# Patient Record
Sex: Female | Born: 2000 | Race: Black or African American | Hispanic: No | Marital: Single | Smoking: Never smoker
Health system: Southern US, Community
[De-identification: ages and names within clinical notes are randomized; demographics above are authoritative.]

## PROBLEM LIST (undated history)

## (undated) DIAGNOSIS — T7840XA Allergy, unspecified, initial encounter: Secondary | ICD-10-CM

## (undated) DIAGNOSIS — J45909 Unspecified asthma, uncomplicated: Secondary | ICD-10-CM

## (undated) DIAGNOSIS — F909 Attention-deficit hyperactivity disorder, unspecified type: Secondary | ICD-10-CM

## (undated) DIAGNOSIS — L83 Acanthosis nigricans: Secondary | ICD-10-CM

## (undated) DIAGNOSIS — E669 Obesity, unspecified: Secondary | ICD-10-CM

## (undated) HISTORY — PX: AMPUTATION FINGER: SHX6594

## (undated) HISTORY — DX: Attention-deficit hyperactivity disorder, unspecified type: F90.9

## (undated) HISTORY — PX: TONSILLECTOMY: SUR1361

## (undated) HISTORY — DX: Unspecified asthma, uncomplicated: J45.909

## (undated) HISTORY — DX: Allergy, unspecified, initial encounter: T78.40XA

## (undated) HISTORY — DX: Obesity, unspecified: E66.9

## (undated) HISTORY — PX: ADENOIDECTOMY: SUR15

## (undated) HISTORY — DX: Acanthosis nigricans: L83

---
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
## 2019-01-03 ENCOUNTER — Encounter: Payer: Self-pay | Admitting: Family Medicine

## 2019-01-03 ENCOUNTER — Encounter: Payer: Self-pay | Admitting: Nurse Practitioner

## 2022-11-09 ENCOUNTER — Ambulatory Visit: Payer: Self-pay

## 2022-11-16 ENCOUNTER — Ambulatory Visit: Payer: Self-pay | Admitting: Family Medicine

## 2022-12-21 ENCOUNTER — Ambulatory Visit: Payer: Medicaid Other | Admitting: Nurse Practitioner

## 2022-12-21 NOTE — Progress Notes (Deleted)
There were no vitals taken for this visit.   Subjective:    Patient ID: Kathryn Brennan, female    DOB: [birth year], 22 y.o.   MRN: [id number]  HPI: Kathryn Brennan is a 22 y.o. female  No chief complaint on file.  Establish care: her last physical was ***.  Medical history includes ***.  Family history includes ***.  Health maintenance ***.   Relevant past medical, surgical, family and social history reviewed and updated as indicated. Interim medical history since our last visit reviewed. Allergies and medications reviewed and updated.  Review of Systems  Constitutional: Negative for fever or weight change.  Respiratory: Negative for cough and shortness of breath.   Cardiovascular: Negative for chest pain or palpitations.  Gastrointestinal: Negative for abdominal pain, no bowel changes.  Musculoskeletal: Negative for gait problem or joint swelling.  Skin: Negative for rash.  Neurological: Negative for dizziness or headache.  No other specific complaints in a complete review of systems (except as listed in HPI above).      Objective:    There were no vitals taken for this visit.  Wt Readings from Last 3 Encounters:  01/22/18 222 lb 4.8 oz (100.8 kg) (99 %, Z= 2.23)*  10/03/17 227 lb 1.6 oz (103 kg) (99 %, Z= 2.28)*  09/06/17 234 lb 6.4 oz (106.3 kg) (>99 %, Z= 2.34)*   * Growth percentiles are based on CDC (Girls, 2-20 Years) data.    Physical Exam  Constitutional: Patient appears well-developed and well-nourished. Obese *** No distress.  HEENT: head atraumatic, normocephalic, pupils equal and reactive to light, ears ***, neck supple, throat within normal limits Cardiovascular: Normal rate, regular rhythm and normal heart sounds.  No murmur heard. No BLE edema. Pulmonary/Chest: Effort normal and breath sounds normal. No respiratory distress. Abdominal: Soft.  There is no tenderness. Psychiatric: Patient has a normal mood and affect. behavior is normal. Judgment and  thought content normal.  Results for orders placed or performed in visit on 01/22/18  CBC  Result Value Ref Range   WBC 8.2 4.5 - 13.0 Thousand/uL   RBC 4.40 3.80 - 5.10 Million/uL   Hemoglobin 12.5 11.5 - 15.3 g/dL   HCT 35.0 09.3 - 81.8 %   MCV 82.5 78.0 - 98.0 fL   MCH 28.4 25.0 - 35.0 pg   MCHC 34.4 31.0 - 36.0 g/dL   RDW 29.9 37.1 - 69.6 %   Platelets 370 140 - 400 Thousand/uL   MPV 10.8 7.5 - 12.5 fL  Fe+TIBC+Fer  Result Value Ref Range   Iron 67 27 - 164 mcg/dL   TIBC 789 381 - 017 mcg/dL (calc)   %SAT 16 8 - 45 % (calc)   Ferritin 15 6 - 67 ng/mL  COMPLETE METABOLIC PANEL WITH GFR  Result Value Ref Range   Glucose, Bld 76 65 - 99 mg/dL   BUN 10 7 - 20 mg/dL   Creat 5.10 2.58 - 5.27 mg/dL   BUN/Creatinine Ratio NOT APPLICABLE 6 - 22 (calc)   Sodium 139 135 - 146 mmol/L   Potassium 4.5 3.8 - 5.1 mmol/L   Chloride 106 98 - 110 mmol/L   CO2 25 20 - 32 mmol/L   Calcium 9.9 8.9 - 10.4 mg/dL   Total Protein 7.4 6.3 - 8.2 g/dL   Albumin 4.5 3.6 - 5.1 g/dL   Globulin 2.9 2.0 - 3.8 g/dL (calc)   AG Ratio 1.6 1.0 - 2.5 (calc)   Total Bilirubin 0.4 0.2 -  1.1 mg/dL   Alkaline phosphatase (APISO) 108 47 - 176 U/L   AST 15 12 - 32 U/L   ALT 13 5 - 32 U/L  VITAMIN D 25 Hydroxy (Vit-D Deficiency, Fractures)  Result Value Ref Range   Vit D, 25-Hydroxy 20 (L) 30 - 100 ng/mL      Assessment & Plan:   Problem List Items Addressed This Visit   None    Follow up plan: No follow-ups on file.
